# Patient Record
Sex: Male | Born: 1965 | Race: White | Hispanic: No | Marital: Married
Health system: Southern US, Community
[De-identification: ages and names within clinical notes are randomized; demographics above are authoritative.]

---

## 2014-07-28 ENCOUNTER — Other Ambulatory Visit: Payer: Self-pay | Admitting: Physician Assistant

## 2014-07-28 DIAGNOSIS — R748 Abnormal levels of other serum enzymes: Secondary | ICD-10-CM

## 2014-07-28 DIAGNOSIS — R1084 Generalized abdominal pain: Secondary | ICD-10-CM

## 2014-07-29 ENCOUNTER — Ambulatory Visit
Admission: RE | Admit: 2014-07-29 | Discharge: 2014-07-29 | Disposition: A | Discharge status: Home / Self Care | Payer: BLUE CROSS/BLUE SHIELD | Source: Ambulatory Visit | Attending: Physician Assistant | Admitting: Physician Assistant

## 2014-07-29 DIAGNOSIS — R1084 Generalized abdominal pain: Secondary | ICD-10-CM

## 2014-07-29 DIAGNOSIS — R748 Abnormal levels of other serum enzymes: Secondary | ICD-10-CM

## 2014-07-29 MED ORDER — IOHEXOL 300 MG/ML  SOLN
125.0000 mL | Freq: Once | INTRAMUSCULAR | Status: AC | PRN
  Administered 2014-07-29: 125 mL via INTRAVENOUS

## 2016-05-24 DIAGNOSIS — D126 Benign neoplasm of colon, unspecified: Secondary | ICD-10-CM | POA: Diagnosis not present

## 2016-05-24 DIAGNOSIS — K635 Polyp of colon: Secondary | ICD-10-CM | POA: Diagnosis not present

## 2016-05-24 DIAGNOSIS — Z1211 Encounter for screening for malignant neoplasm of colon: Secondary | ICD-10-CM | POA: Diagnosis not present

## 2016-05-28 DIAGNOSIS — D126 Benign neoplasm of colon, unspecified: Secondary | ICD-10-CM | POA: Diagnosis not present

## 2016-08-19 IMAGING — CT CT ABDOMEN W/ CM
1 of 3 series · 14 of 32 positions shown, 19 images · IV contrast (omnipaque)
Comparison: None.

CLINICAL DATA: Left lower quadrant abdominal pain for 1 week.
Elevated lipase. Mid low back pain.

EXAM:
CT ABDOMEN WITH CONTRAST
TECHNIQUE: Multidetector CT imaging of the abdomen was performed using the
standard protocol following bolus administration of intravenous
contrast.
CONTRAST:  125mL OMNIPAQUE IOHEXOL 300 MG/ML  SOLN

[Series 2: abd pelvis 5.0 i41s 1 · axial · 0.84mm/px · z∈[-491,-51]mm · 14 of 98 slices shown, 19 images]
[im 5/98  soft-tissue]
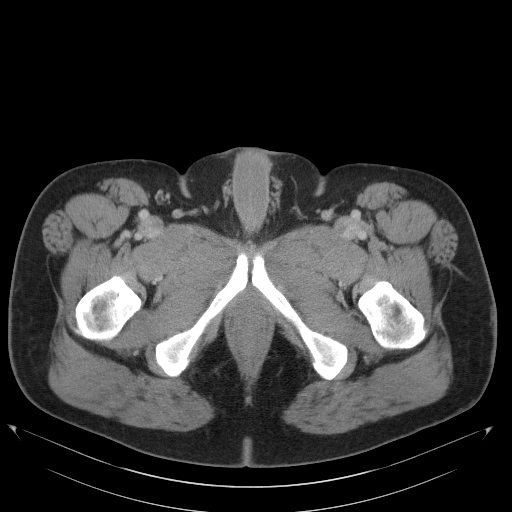
[im 5/98  bone]
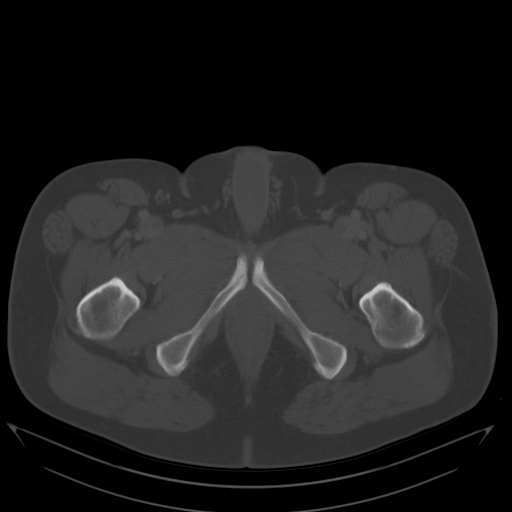
[im 15/98  soft-tissue]
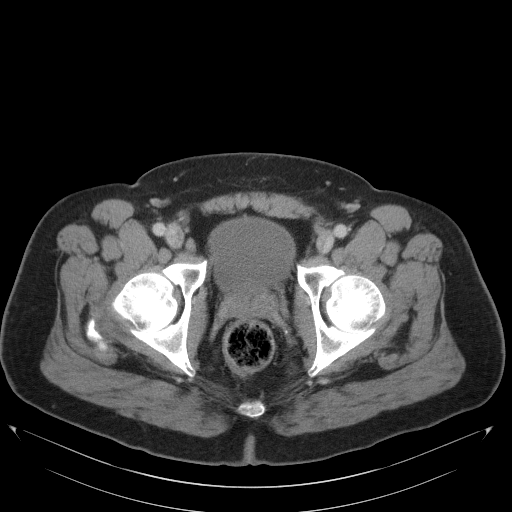
[im 20/98  soft-tissue]
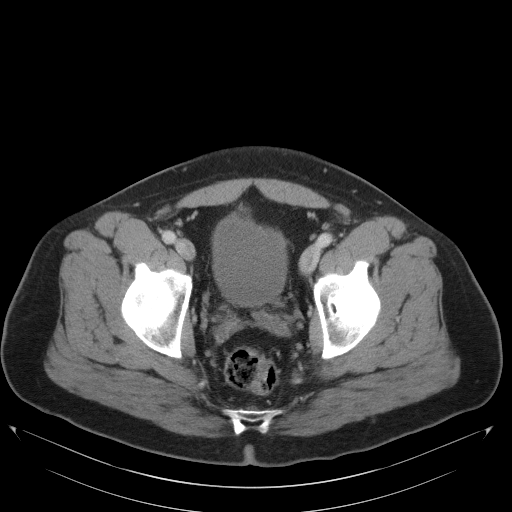
[im 30/98  soft-tissue]
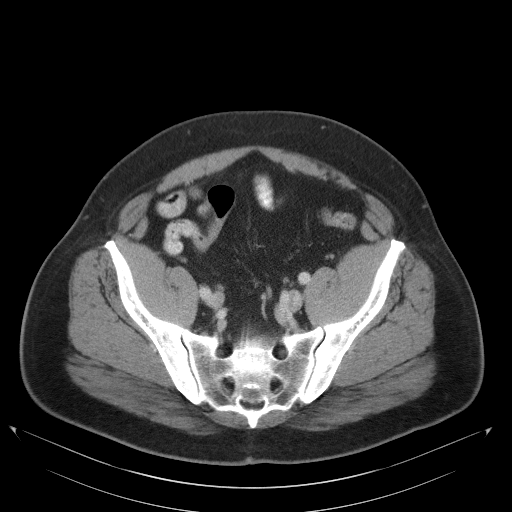
[im 34/98  soft-tissue]
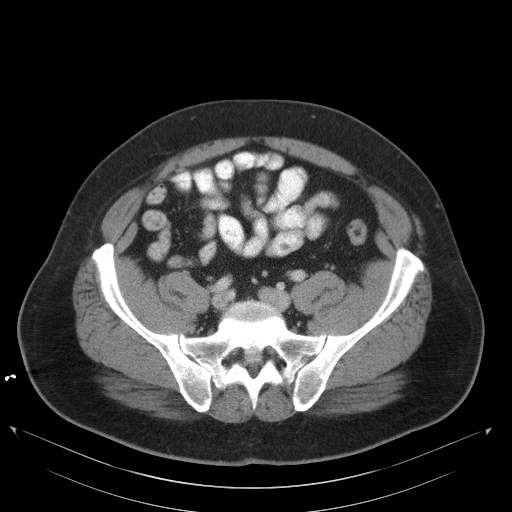
[im 44/98  soft-tissue]
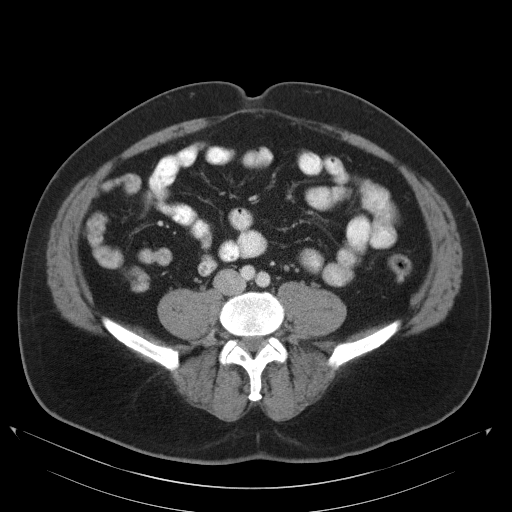
[im 49/98  soft-tissue]
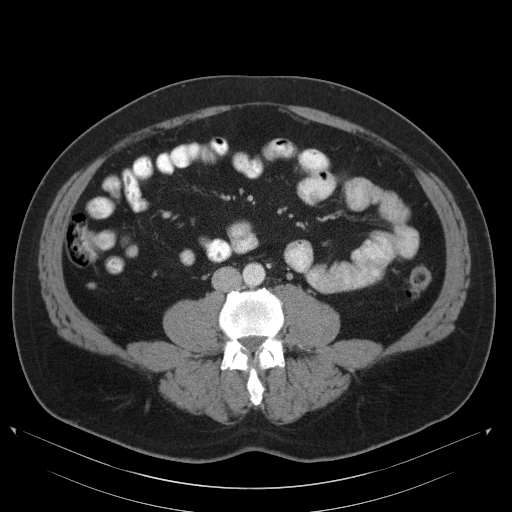
[im 54/98  soft-tissue]
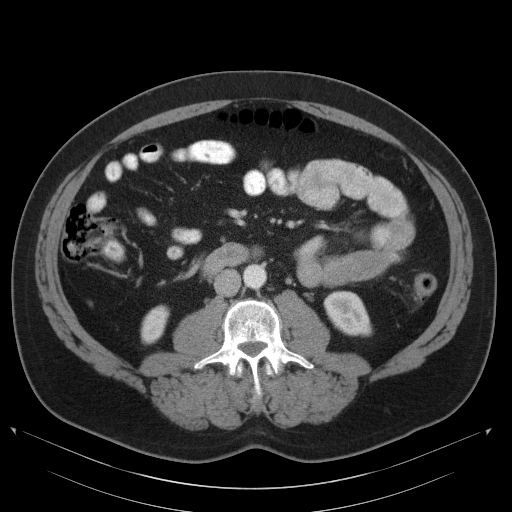
[im 64/98  soft-tissue]
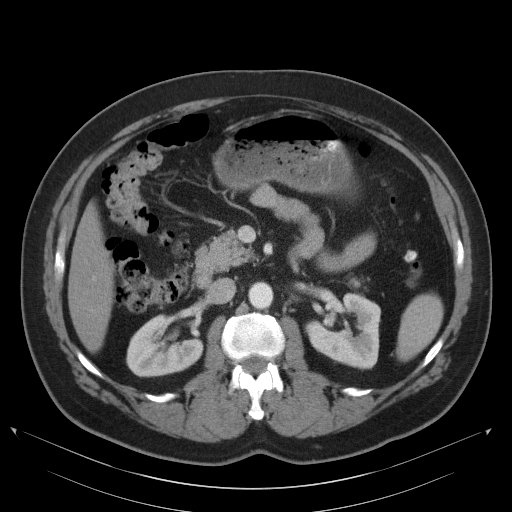
[im 64/98  bone]
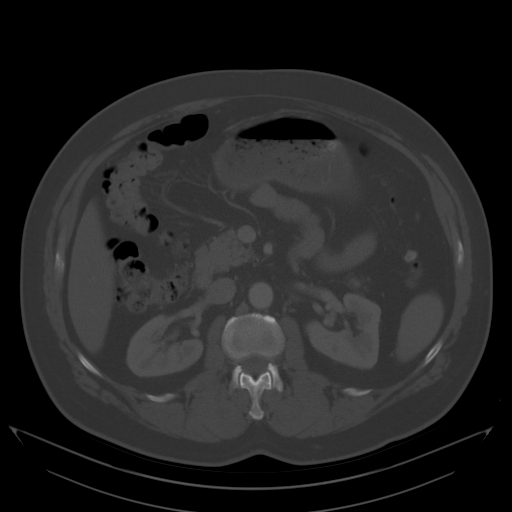
[im 68/98  soft-tissue]
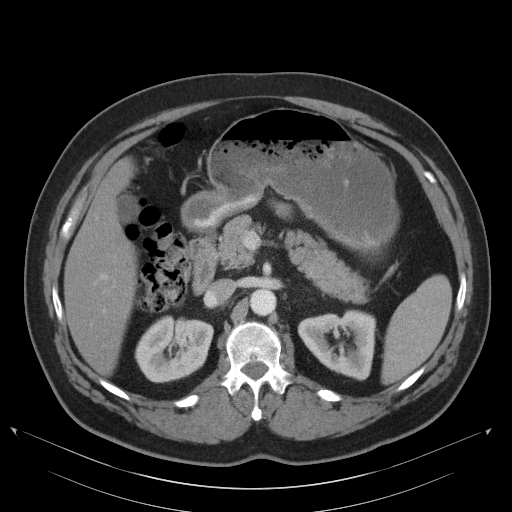
[im 78/98  soft-tissue]
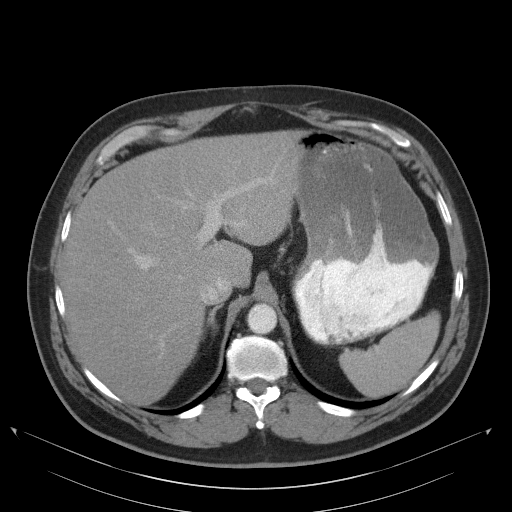
[im 78/98  lung]
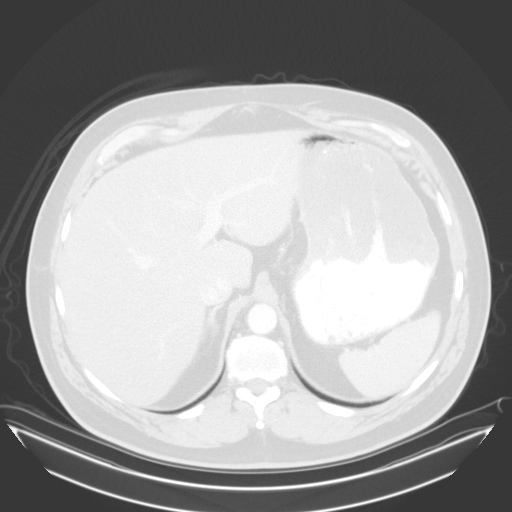
[im 83/98  soft-tissue]
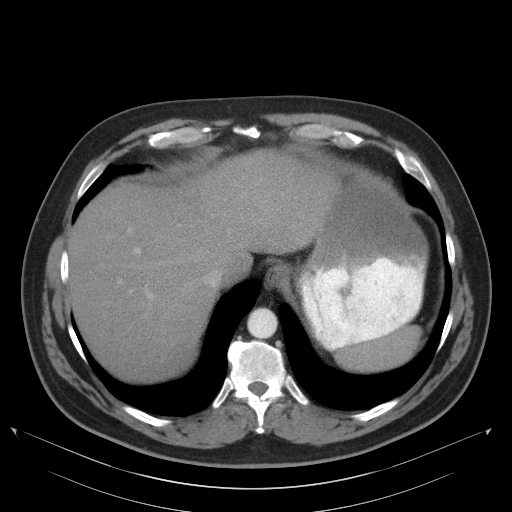
[im 83/98  lung]
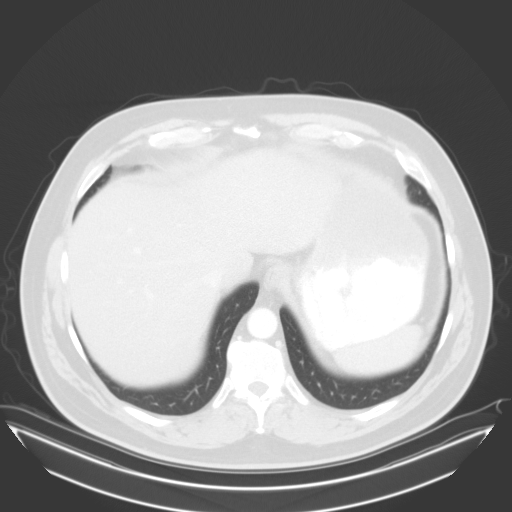
[im 88/98  lung]
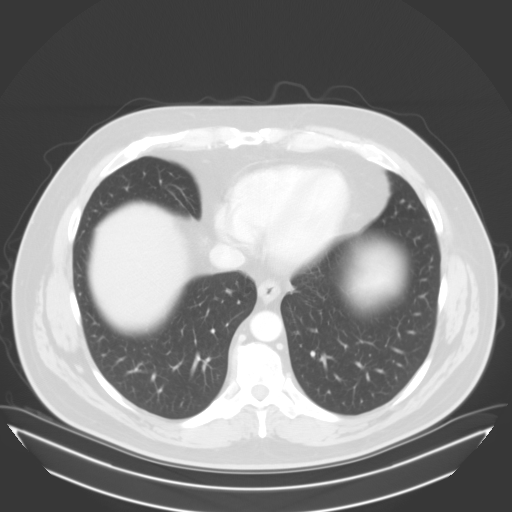
[im 93/98  soft-tissue]
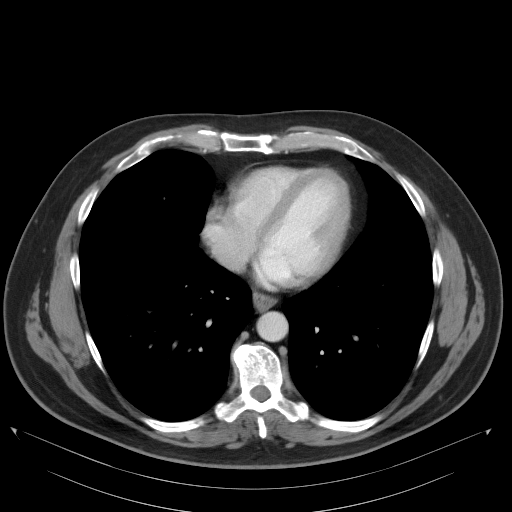
[im 93/98  lung]
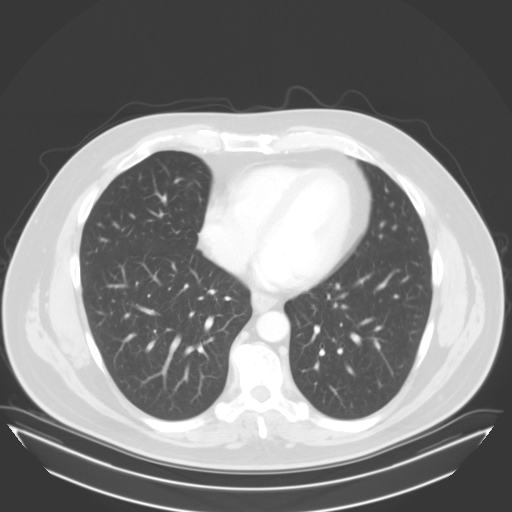

[14 of 32 positions shown; findings below may reference images not displayed]

FINDINGS: Lower chest:  Unremarkable

Hepatobiliary: Subtle hypodense lesion in segment 2 of the liver,
0.9 by 0.5 cm on image 17 series 2, likely a cyst or similar benign
structure but technically nonspecific due to small size.

Pancreas: Unremarkable

Spleen: Unremarkable

Adrenals/Urinary Tract: Unremarkable

Stomach/Bowel: Descending colon diverticulosis with mild
diverticulitis as shown on image 46 series 2 and image 113 series 4.
No extraluminal gas or abscess.

Vascular/Lymphatic: Unremarkable

Reproductive: Unremarkable

Other: No supplemental non-categorized findings.

Musculoskeletal: Disc bulges at L4-5 and L5-S1 with suspected mild
central narrowing of the thecal sac at L4-5. Small umbilical hernia
contains adipose tissue.
IMPRESSION: 1. Mild acute diverticulitis of the descending colon. No
extraluminal gas or abscess.
2. Disc bulges at L4-5 and L5-S 1, with suspected mild central
narrowing of the thecal sac at L4-5.
3. Subtle small hypodense lesion in segment 2 of the liver,
technically too small to characterize, but statistically likely to
be a cyst or similar small benign lesion.

## 2017-01-07 DIAGNOSIS — Z131 Encounter for screening for diabetes mellitus: Secondary | ICD-10-CM | POA: Diagnosis not present

## 2017-01-07 DIAGNOSIS — Z136 Encounter for screening for cardiovascular disorders: Secondary | ICD-10-CM | POA: Diagnosis not present

## 2017-01-07 DIAGNOSIS — Z0000 Encounter for general adult medical examination without abnormal findings: Secondary | ICD-10-CM | POA: Diagnosis not present

## 2017-01-07 DIAGNOSIS — Z125 Encounter for screening for malignant neoplasm of prostate: Secondary | ICD-10-CM | POA: Diagnosis not present

## 2017-10-22 DIAGNOSIS — M545 Low back pain: Secondary | ICD-10-CM | POA: Diagnosis not present

## 2017-10-22 DIAGNOSIS — L821 Other seborrheic keratosis: Secondary | ICD-10-CM | POA: Diagnosis not present

## 2017-10-22 DIAGNOSIS — L723 Sebaceous cyst: Secondary | ICD-10-CM | POA: Diagnosis not present

## 2017-10-22 DIAGNOSIS — N5089 Other specified disorders of the male genital organs: Secondary | ICD-10-CM | POA: Diagnosis not present

## 2017-11-13 ENCOUNTER — Ambulatory Visit: Payer: Self-pay | Admitting: Surgery

## 2017-11-13 DIAGNOSIS — K429 Umbilical hernia without obstruction or gangrene: Secondary | ICD-10-CM | POA: Diagnosis not present

## 2017-11-13 DIAGNOSIS — D171 Benign lipomatous neoplasm of skin and subcutaneous tissue of trunk: Secondary | ICD-10-CM | POA: Diagnosis not present

## 2017-11-13 DIAGNOSIS — N5089 Other specified disorders of the male genital organs: Secondary | ICD-10-CM | POA: Diagnosis not present

## 2017-11-13 NOTE — H&P (Signed)
History of Present Illness Wilmon Arms. Janiyha Montufar MD; 11/13/2017 5:33 PM) The patient is a 52 year old male who presents with an umbilical hernia. Referred by Dr. Abigail Miyamoto for umbilical hernia, back mass, and perineal drainage  This is a 52 year old male in good health who presents with several different problems. Over the last year he has developed an enlarging bulge at the upper part of his umbilicus. This has become larger. He remains reducible. It causes minimal discomfort. He denies any GI obstructive symptoms. He was evaluated by Dr. Selina Cooley who felt that this represented an umbilical hernia. He is now referred for surgical evaluation.  Also over the last 6 months or so the patient has developed a large mass in the mid back. His wife has noticed this through his shirt. This has become fairly large and firm. There has never been any sign of infection. No drainage from this area. He would like to have this area removed because it keeps getting larger and causes some mild discomfort.  Over the last couple of years the patient has had intermittent drainage from a small pinhole in his perineum. It occasionally this become significant enough to cause some staining on the bed sheets or even through his clothes. He denies a large mass in this area. He has never had any imaging of this area. Previously he did have a scrotal cyst that spontaneously resolved.   Past Surgical History (April Staton, [place]; 11/13/2017 2:44 PM) Colon Polyp Removal - Colonoscopy  Diagnostic Studies History (April Staton, [place]; 11/13/2017 2:44 PM) Colonoscopy 1-5 years ago  Allergies (April Staton, CMA; 11/13/2017 2:45 PM) No Known Drug Allergies [11/13/2017]:  Medication History (April Staton, CMA; 11/13/2017 2:45 PM) Multi-Vitamin (Oral) Active. Omega 3 (1000MG  Capsule, Oral) Active. Medications Reconciled  Social History (April Staton, [place]; 11/13/2017 2:44 PM) Alcohol use Moderate alcohol use. Caffeine use  Coffee. Illicit drug use Remotely quit drug use. Tobacco use Former smoker.  Family History (April Staton, [place]; 11/13/2017 2:44 PM) Arthritis Mother. Depression Brother.  Other Problems (April Staton, CMA; 11/13/2017 2:44 PM) Arthritis Back Pain Diverticulosis Hemorrhoids Umbilical Hernia Repair     Review of Systems (April Staton CMA; 11/13/2017 2:44 PM) General Not Present- Appetite Loss, Chills, Fatigue, Fever, Night Sweats, Weight Gain and Weight Loss. Skin Not Present- Change in Wart/Mole, Dryness, Hives, Jaundice, New Lesions, Non-Healing Wounds, Rash and Ulcer. HEENT Present- Wears glasses/contact lenses. Not Present- Earache, Hearing Loss, Hoarseness, Nose Bleed, Oral Ulcers, Ringing in the Ears, Seasonal Allergies, Sinus Pain, Sore Throat, Visual Disturbances and Yellow Eyes. Respiratory Present- Snoring. Not Present- Bloody sputum, Chronic Cough, Difficulty Breathing and Wheezing. Breast Not Present- Breast Mass, Breast Pain, Nipple Discharge and Skin Changes. Cardiovascular Not Present- Chest Pain, Difficulty Breathing Lying Down, Leg Cramps, Palpitations, Rapid Heart Rate, Shortness of Breath and Swelling of Extremities. Gastrointestinal Present- Hemorrhoids. Not Present- Abdominal Pain, Bloating, Bloody Stool, Change in Bowel Habits, Chronic diarrhea, Constipation, Difficulty Swallowing, Excessive gas, Gets full quickly at meals, Indigestion, Nausea, Rectal Pain and Vomiting. Male Genitourinary Not Present- Blood in Urine, Change in Urinary Stream, Frequency, Impotence, Nocturia, Painful Urination, Urgency and Urine Leakage. Musculoskeletal Present- Back Pain and Joint Pain. Not Present- Joint Stiffness, Muscle Pain, Muscle Weakness and Swelling of Extremities. Neurological Not Present- Decreased Memory, Fainting, Headaches, Numbness, Seizures, Tingling, Tremor, Trouble walking and Weakness. Psychiatric Not Present- Anxiety, Bipolar, Change in Sleep Pattern, Depression,  Fearful and Frequent crying. Endocrine Not Present- Cold Intolerance, Excessive Hunger, Hair Changes, Heat Intolerance, Hot flashes and New Diabetes. Hematology  Not Present- Blood Thinners, Easy Bruising, Excessive bleeding, Gland problems, HIV and Persistent Infections.  Vitals (April Staton CMA; 11/13/2017 2:46 PM) 11/13/2017 2:45 PM Weight: 258.38 lb Height: 76in Body Surface Area: 2.47 m Body Mass Index: 31.45 kg/m  Temp.: 97.26F(Oral)  Pulse: 84 (Regular)  BP: 112/84 (Sitting, Left Arm, Standard)      Physical Exam Molli Hazard K. Janaki Exley MD; 11/13/2017 5:35 PM)  The physical exam findings are as follows: Note:WDWN in NAD Eyes: Pupils equal, round; sclera anicteric HENT: Oral mucosa moist; good dentition Neck: No masses palpated, no thyromegaly Lungs: CTA bilaterally; normal respiratory effort CV: Regular rate and rhythm; no murmurs; extremities well-perfused with no edema Abd: +bowel sounds, soft, non-tender, no palpable organomegaly; palpable umbilical hernia at upper edge - easily reducible - 1.5 cm defect Back: mid-back - protruding 3 cm subcutaneous mass, smooth, mobile, no overlying skin changes Perineum: no drainage noted currently; small 7 mm palpable mass to the right of midline in the area where he states that he notices the drainage; cannot visualize opening; anterior to this area in the midline there is a small 1 cm palpable subcutaneous mass, non-tender; new onset in the last few days Skin: Warm, dry; no sign of jaundice Psychiatric - alert and oriented x 4; calm mood and affect    Assessment & Plan Molli Hazard K. Edie Darley MD; 11/13/2017 5:36 PM)  UMBILICAL HERNIA WITHOUT OBSTRUCTION OR GANGRENE (K42.9)  Current Plans Schedule for Surgery - Umbilical hernia repair with mesh/ excision of lipoma back. The surgical procedure has been discussed with the patient. Potential risks, benefits, alternative treatments, and expected outcomes have been explained. All of the  patient's questions at this time have been answered. The likelihood of reaching the patient's treatment goal is good. The patient understand the proposed surgical procedure and wishes to proceed.  LIPOMA OF BACK (D17.1) Impression: 3 cm, mid-back   PERINEAL CYST IN MALE (N50.89)  Current Plans Started Doxycycline Hyclate 100 MG Oral Tablet, 1 (one) Tablet two times daily, #14, 11/13/2017, No Refill. Note:The patient wants to try nonoperative management of the perineal cyst/drainage prior to proceeding with surgery. We will plan to repair his umbilical hernia as well as excising his back mass in lateral position under anesthesia. We will try to treat the perineal drainage with antibiotics.  Wilmon Arms. Corliss Skains, MD, [facility]  General/ Trauma Surgery Beeper [phone]  11/13/2017 5:36 PM

## 2017-12-16 DIAGNOSIS — Z131 Encounter for screening for diabetes mellitus: Secondary | ICD-10-CM | POA: Diagnosis not present

## 2017-12-16 DIAGNOSIS — Z0000 Encounter for general adult medical examination without abnormal findings: Secondary | ICD-10-CM | POA: Diagnosis not present

## 2017-12-16 DIAGNOSIS — E7800 Pure hypercholesterolemia, unspecified: Secondary | ICD-10-CM | POA: Diagnosis not present

## 2018-02-11 DIAGNOSIS — K429 Umbilical hernia without obstruction or gangrene: Secondary | ICD-10-CM | POA: Diagnosis not present

## 2018-02-11 DIAGNOSIS — L720 Epidermal cyst: Secondary | ICD-10-CM | POA: Diagnosis not present

## 2018-02-11 DIAGNOSIS — L723 Sebaceous cyst: Secondary | ICD-10-CM | POA: Diagnosis not present

## 2018-09-06 DIAGNOSIS — Z20828 Contact with and (suspected) exposure to other viral communicable diseases: Secondary | ICD-10-CM | POA: Diagnosis not present

## 2018-11-10 DIAGNOSIS — R21 Rash and other nonspecific skin eruption: Secondary | ICD-10-CM | POA: Diagnosis not present

## 2018-12-10 DIAGNOSIS — L918 Other hypertrophic disorders of the skin: Secondary | ICD-10-CM | POA: Diagnosis not present

## 2018-12-10 DIAGNOSIS — L738 Other specified follicular disorders: Secondary | ICD-10-CM | POA: Diagnosis not present

## 2018-12-10 DIAGNOSIS — L920 Granuloma annulare: Secondary | ICD-10-CM | POA: Diagnosis not present

## 2018-12-10 DIAGNOSIS — D2372 Other benign neoplasm of skin of left lower limb, including hip: Secondary | ICD-10-CM | POA: Diagnosis not present

## 2019-01-12 DIAGNOSIS — Z125 Encounter for screening for malignant neoplasm of prostate: Secondary | ICD-10-CM | POA: Diagnosis not present

## 2019-01-12 DIAGNOSIS — Z0000 Encounter for general adult medical examination without abnormal findings: Secondary | ICD-10-CM | POA: Diagnosis not present

## 2019-01-12 DIAGNOSIS — E7800 Pure hypercholesterolemia, unspecified: Secondary | ICD-10-CM | POA: Diagnosis not present

## 2019-02-25 DIAGNOSIS — Z125 Encounter for screening for malignant neoplasm of prostate: Secondary | ICD-10-CM | POA: Diagnosis not present

## 2019-04-20 DIAGNOSIS — Z125 Encounter for screening for malignant neoplasm of prostate: Secondary | ICD-10-CM | POA: Diagnosis not present

## 2020-01-27 DIAGNOSIS — Z23 Encounter for immunization: Secondary | ICD-10-CM | POA: Diagnosis not present

## 2020-01-27 DIAGNOSIS — E7800 Pure hypercholesterolemia, unspecified: Secondary | ICD-10-CM | POA: Diagnosis not present

## 2020-01-27 DIAGNOSIS — Z125 Encounter for screening for malignant neoplasm of prostate: Secondary | ICD-10-CM | POA: Diagnosis not present

## 2020-01-27 DIAGNOSIS — Z0000 Encounter for general adult medical examination without abnormal findings: Secondary | ICD-10-CM | POA: Diagnosis not present

## 2021-11-27 DIAGNOSIS — Z0000 Encounter for general adult medical examination without abnormal findings: Secondary | ICD-10-CM | POA: Diagnosis not present

## 2021-11-27 DIAGNOSIS — Z125 Encounter for screening for malignant neoplasm of prostate: Secondary | ICD-10-CM | POA: Diagnosis not present

## 2021-11-27 DIAGNOSIS — E782 Mixed hyperlipidemia: Secondary | ICD-10-CM | POA: Diagnosis not present

## 2022-01-07 DIAGNOSIS — L821 Other seborrheic keratosis: Secondary | ICD-10-CM | POA: Diagnosis not present

## 2022-01-07 DIAGNOSIS — D225 Melanocytic nevi of trunk: Secondary | ICD-10-CM | POA: Diagnosis not present

## 2022-01-07 DIAGNOSIS — L570 Actinic keratosis: Secondary | ICD-10-CM | POA: Diagnosis not present

## 2022-01-07 DIAGNOSIS — L300 Nummular dermatitis: Secondary | ICD-10-CM | POA: Diagnosis not present

## 2022-01-07 DIAGNOSIS — D2372 Other benign neoplasm of skin of left lower limb, including hip: Secondary | ICD-10-CM | POA: Diagnosis not present

## 2022-05-10 DIAGNOSIS — L738 Other specified follicular disorders: Secondary | ICD-10-CM | POA: Diagnosis not present

## 2022-05-10 DIAGNOSIS — L821 Other seborrheic keratosis: Secondary | ICD-10-CM | POA: Diagnosis not present

## 2022-05-10 DIAGNOSIS — L578 Other skin changes due to chronic exposure to nonionizing radiation: Secondary | ICD-10-CM | POA: Diagnosis not present

## 2022-05-10 DIAGNOSIS — L570 Actinic keratosis: Secondary | ICD-10-CM | POA: Diagnosis not present

## 2022-05-10 DIAGNOSIS — D1801 Hemangioma of skin and subcutaneous tissue: Secondary | ICD-10-CM | POA: Diagnosis not present

## 2022-05-22 DIAGNOSIS — J069 Acute upper respiratory infection, unspecified: Secondary | ICD-10-CM | POA: Diagnosis not present

## 2022-12-10 DIAGNOSIS — Z125 Encounter for screening for malignant neoplasm of prostate: Secondary | ICD-10-CM | POA: Diagnosis not present

## 2022-12-10 DIAGNOSIS — Z0000 Encounter for general adult medical examination without abnormal findings: Secondary | ICD-10-CM | POA: Diagnosis not present

## 2022-12-10 DIAGNOSIS — R109 Unspecified abdominal pain: Secondary | ICD-10-CM | POA: Diagnosis not present

## 2022-12-10 DIAGNOSIS — Z13220 Encounter for screening for lipoid disorders: Secondary | ICD-10-CM | POA: Diagnosis not present

## 2022-12-18 ENCOUNTER — Other Ambulatory Visit: Payer: Self-pay | Admitting: Family Medicine

## 2022-12-18 DIAGNOSIS — R109 Unspecified abdominal pain: Secondary | ICD-10-CM

## 2023-01-20 ENCOUNTER — Ambulatory Visit
Admission: RE | Admit: 2023-01-20 | Discharge: 2023-01-20 | Disposition: A | Discharge status: Home / Self Care | Payer: BC Managed Care – PPO | Source: Ambulatory Visit | Attending: Family Medicine | Admitting: Family Medicine

## 2023-01-20 DIAGNOSIS — R109 Unspecified abdominal pain: Secondary | ICD-10-CM

## 2023-01-20 DIAGNOSIS — R8290 Unspecified abnormal findings in urine: Secondary | ICD-10-CM | POA: Diagnosis not present

## 2023-03-21 DIAGNOSIS — Z860100 Personal history of colon polyps, unspecified: Secondary | ICD-10-CM | POA: Diagnosis not present

## 2023-03-21 DIAGNOSIS — K635 Polyp of colon: Secondary | ICD-10-CM | POA: Diagnosis not present

## 2023-03-21 DIAGNOSIS — D120 Benign neoplasm of cecum: Secondary | ICD-10-CM | POA: Diagnosis not present

## 2023-03-21 DIAGNOSIS — K5730 Diverticulosis of large intestine without perforation or abscess without bleeding: Secondary | ICD-10-CM | POA: Diagnosis not present

## 2023-03-21 DIAGNOSIS — Z09 Encounter for follow-up examination after completed treatment for conditions other than malignant neoplasm: Secondary | ICD-10-CM | POA: Diagnosis not present

## 2023-08-06 DIAGNOSIS — R079 Chest pain, unspecified: Secondary | ICD-10-CM | POA: Diagnosis not present

## 2023-08-06 DIAGNOSIS — R1011 Right upper quadrant pain: Secondary | ICD-10-CM | POA: Diagnosis not present

## 2023-08-06 DIAGNOSIS — F419 Anxiety disorder, unspecified: Secondary | ICD-10-CM | POA: Diagnosis not present

## 2023-10-17 DIAGNOSIS — M25562 Pain in left knee: Secondary | ICD-10-CM | POA: Diagnosis not present

## 2023-11-17 DIAGNOSIS — M25562 Pain in left knee: Secondary | ICD-10-CM | POA: Diagnosis not present

## 2023-12-16 DIAGNOSIS — Z0000 Encounter for general adult medical examination without abnormal findings: Secondary | ICD-10-CM | POA: Diagnosis not present

## 2023-12-16 DIAGNOSIS — Z23 Encounter for immunization: Secondary | ICD-10-CM | POA: Diagnosis not present

## 2023-12-16 DIAGNOSIS — Z13220 Encounter for screening for lipoid disorders: Secondary | ICD-10-CM | POA: Diagnosis not present

## 2023-12-29 DIAGNOSIS — M25562 Pain in left knee: Secondary | ICD-10-CM | POA: Diagnosis not present

## 2024-02-16 DIAGNOSIS — Z23 Encounter for immunization: Secondary | ICD-10-CM | POA: Diagnosis not present
